# Patient Record
Sex: Female | Born: 1965
Health system: Southern US, Community
[De-identification: ages and names within clinical notes are randomized; demographics above are authoritative.]

## PROBLEM LIST (undated history)

## (undated) DIAGNOSIS — G43909 Migraine, unspecified, not intractable, without status migrainosus: Secondary | ICD-10-CM

## (undated) DIAGNOSIS — Z800 Family history of malignant neoplasm of digestive organs: Secondary | ICD-10-CM

## (undated) HISTORY — DX: Migraine, unspecified, not intractable, without status migrainosus: G43.909

## (undated) HISTORY — PX: VAGINAL HYSTERECTOMY: SUR661

## (undated) HISTORY — PX: TONSILLECTOMY: SUR1361

## (undated) HISTORY — PX: WISDOM TOOTH EXTRACTION: SHX21

## (undated) HISTORY — DX: Family history of malignant neoplasm of digestive organs: Z80.0

## (undated) HISTORY — PX: BREAST CYST ASPIRATION: SHX578

---

## 2005-02-09 ENCOUNTER — Ambulatory Visit: Payer: Self-pay

## 2006-02-19 ENCOUNTER — Ambulatory Visit: Payer: Self-pay

## 2007-03-04 ENCOUNTER — Ambulatory Visit: Payer: Self-pay

## 2007-03-17 ENCOUNTER — Ambulatory Visit: Payer: Self-pay

## 2008-04-13 ENCOUNTER — Ambulatory Visit: Payer: Self-pay

## 2009-04-19 ENCOUNTER — Ambulatory Visit: Payer: Self-pay

## 2010-04-20 ENCOUNTER — Ambulatory Visit: Payer: Self-pay

## 2011-05-21 ENCOUNTER — Ambulatory Visit: Payer: Self-pay

## 2016-04-24 ENCOUNTER — Other Ambulatory Visit: Payer: Self-pay | Admitting: Obstetrics & Gynecology

## 2016-04-24 DIAGNOSIS — Z1231 Encounter for screening mammogram for malignant neoplasm of breast: Secondary | ICD-10-CM

## 2016-05-28 ENCOUNTER — Encounter (HOSPITAL_COMMUNITY): Payer: Self-pay

## 2016-05-28 ENCOUNTER — Ambulatory Visit
Admission: RE | Admit: 2016-05-28 | Discharge: 2016-05-28 | Disposition: A | Discharge status: Home / Self Care | Payer: BLUE CROSS/BLUE SHIELD | Source: Ambulatory Visit | Attending: Obstetrics & Gynecology | Admitting: Obstetrics & Gynecology

## 2016-05-28 DIAGNOSIS — Z1231 Encounter for screening mammogram for malignant neoplasm of breast: Secondary | ICD-10-CM | POA: Diagnosis not present

## 2016-05-31 ENCOUNTER — Inpatient Hospital Stay
Admission: RE | Admit: 2016-05-31 | Discharge: 2016-05-31 | Disposition: A | Discharge status: Home / Self Care | Payer: Self-pay | Source: Ambulatory Visit | Attending: *Deleted | Admitting: *Deleted

## 2016-05-31 ENCOUNTER — Other Ambulatory Visit: Payer: Self-pay | Admitting: *Deleted

## 2016-05-31 DIAGNOSIS — Z9289 Personal history of other medical treatment: Secondary | ICD-10-CM

## 2016-06-27 DIAGNOSIS — D485 Neoplasm of uncertain behavior of skin: Secondary | ICD-10-CM | POA: Diagnosis not present

## 2016-06-27 DIAGNOSIS — Z1283 Encounter for screening for malignant neoplasm of skin: Secondary | ICD-10-CM | POA: Diagnosis not present

## 2016-06-27 DIAGNOSIS — Z85828 Personal history of other malignant neoplasm of skin: Secondary | ICD-10-CM | POA: Diagnosis not present

## 2016-06-27 DIAGNOSIS — L821 Other seborrheic keratosis: Secondary | ICD-10-CM | POA: Diagnosis not present

## 2016-12-24 DIAGNOSIS — R1310 Dysphagia, unspecified: Secondary | ICD-10-CM | POA: Diagnosis not present

## 2017-01-17 DIAGNOSIS — Z23 Encounter for immunization: Secondary | ICD-10-CM | POA: Diagnosis not present

## 2017-02-11 DIAGNOSIS — K219 Gastro-esophageal reflux disease without esophagitis: Secondary | ICD-10-CM | POA: Diagnosis not present

## 2017-03-14 DIAGNOSIS — E538 Deficiency of other specified B group vitamins: Secondary | ICD-10-CM | POA: Diagnosis not present

## 2017-03-21 DIAGNOSIS — E538 Deficiency of other specified B group vitamins: Secondary | ICD-10-CM | POA: Diagnosis not present

## 2017-03-21 DIAGNOSIS — Z0000 Encounter for general adult medical examination without abnormal findings: Secondary | ICD-10-CM | POA: Diagnosis not present

## 2017-04-22 ENCOUNTER — Other Ambulatory Visit: Payer: Self-pay | Admitting: Internal Medicine

## 2017-04-22 DIAGNOSIS — Z1231 Encounter for screening mammogram for malignant neoplasm of breast: Secondary | ICD-10-CM

## 2017-05-13 DIAGNOSIS — J040 Acute laryngitis: Secondary | ICD-10-CM | POA: Diagnosis not present

## 2017-05-29 ENCOUNTER — Ambulatory Visit
Admission: RE | Admit: 2017-05-29 | Discharge: 2017-05-29 | Disposition: A | Discharge status: Home / Self Care | Payer: BLUE CROSS/BLUE SHIELD | Source: Ambulatory Visit | Attending: Internal Medicine | Admitting: Internal Medicine

## 2017-05-29 DIAGNOSIS — Z1231 Encounter for screening mammogram for malignant neoplasm of breast: Secondary | ICD-10-CM | POA: Diagnosis not present

## 2017-07-15 DIAGNOSIS — L821 Other seborrheic keratosis: Secondary | ICD-10-CM | POA: Diagnosis not present

## 2017-07-15 DIAGNOSIS — Z1283 Encounter for screening for malignant neoplasm of skin: Secondary | ICD-10-CM | POA: Diagnosis not present

## 2017-07-15 DIAGNOSIS — Z85828 Personal history of other malignant neoplasm of skin: Secondary | ICD-10-CM | POA: Diagnosis not present

## 2017-07-15 DIAGNOSIS — D485 Neoplasm of uncertain behavior of skin: Secondary | ICD-10-CM | POA: Diagnosis not present

## 2017-09-16 ENCOUNTER — Ambulatory Visit: Payer: Self-pay | Admitting: Obstetrics and Gynecology

## 2017-09-16 ENCOUNTER — Ambulatory Visit (INDEPENDENT_AMBULATORY_CARE_PROVIDER_SITE_OTHER): Payer: BLUE CROSS/BLUE SHIELD | Admitting: Obstetrics and Gynecology

## 2017-09-16 ENCOUNTER — Encounter: Payer: Self-pay | Admitting: Obstetrics and Gynecology

## 2017-09-16 VITALS — BP 120/80 | Ht 68.0 in | Wt 175.0 lb

## 2017-09-16 DIAGNOSIS — N764 Abscess of vulva: Secondary | ICD-10-CM

## 2017-09-16 MED ORDER — SULFAMETHOXAZOLE-TRIMETHOPRIM 800-160 MG PO TABS: 1.0000 | ORAL_TABLET | Freq: Two times a day (BID) | ORAL | 0 refills | Status: DC

## 2017-09-16 NOTE — Progress Notes (Signed)
Obstetrics & Gynecology Office Visit   Chief Complaint:  Chief Complaint  Patient presents with  . vaginal bump    History of Present Illness: Patient is a 52 year old caucasian female presenting for evaluation of a tender vulvar/labial mass.  The area became quite bothersome to the patient last week and she had tried to obtain an appointment on Friday but was unable to be seen until today.  There has been no drainage from the area.  She has not noted any fevers, chills, or inciting trauma to the area. The patient denies similar lesion in the past.  Pain is worsened with ambulation, and sitting.  7/10 pain.  Review of Systems: 10 point review of systems negative unless otherwise noted in hpi  Past Medical History:  History reviewed. No pertinent past medical history.  Past Surgical History:  Past Surgical History:  Procedure Laterality Date  . BREAST CYST ASPIRATION     Pt does not remember    Gynecologic History: No LMP recorded. Patient has had a hysterectomy.  Obstetric History: G1P1001  Family History:  Family History  Problem Relation Age of Onset  . Breast cancer Neg Hx     Social History:  Social History   Socioeconomic History  . Marital status: Single    Spouse name: Not on file  . Number of children: Not on file  . Years of education: Not on file  . Highest education level: Not on file  Occupational History  . Not on file  Social Needs  . Financial resource strain: Not on file  . Food insecurity:    Worry: Not on file    Inability: Not on file  . Transportation needs:    Medical: Not on file    Non-medical: Not on file  Tobacco Use  . Smoking status: Never Smoker  . Smokeless tobacco: Never Used  Substance and Sexual Activity  . Alcohol use: Not Currently  . Drug use: Never  . Sexual activity: Not Currently  Lifestyle  . Physical activity:    Days per week: Not on file    Minutes per session: Not on file  . Stress: Not on file    Relationships  . Social connections:    Talks on phone: Not on file    Gets together: Not on file    Attends religious service: Not on file    Active member of club or organization: Not on file    Attends meetings of clubs or organizations: Not on file    Relationship status: Not on file  . Intimate partner violence:    Fear of current or ex partner: Not on file    Emotionally abused: Not on file    Physically abused: Not on file    Forced sexual activity: Not on file  Other Topics Concern  . Not on file  Social History Narrative  . Not on file    Allergies:  Allergies  Allergen Reactions  . Erythromycin Diarrhea  . Venlafaxine Other (See Comments)    Paranoia    Medications: Prior to Admission medications   Medication Sig Start Date End Date Taking? Authorizing Provider  ALPRAZolam (XANAX) 0.25 MG tablet TAKE 1 TABLET BY MOUTH TWICE A DAY 06/20/17  Yes [provider]  diclofenac (VOLTAREN) 75 MG EC tablet TAKE 1 TABLET TWICE DAILY WITH FOOD 03/10/17  Yes [provider]  DULoxetine (CYMBALTA) 30 MG capsule TAKE 1 CAPSULE (30 MG TOTAL) BY MOUTH ONCE DAILY.  11/23/16  Yes [provider]  pantoprazole (PROTONIX) 40 MG tablet Take by mouth. 02/11/17 02/11/18 Yes [provider]  SUMAtriptan (IMITREX) 50 MG tablet TAKE 1 TABLET BY MOUTH AS NEEDED FOR MIGRAINE FOR 1 DOSE-MAY TAKE 2ND DOSE AFTER 2 HOURS IF NEEDED 05/13/17  Yes [provider]  docusate sodium (COLACE) 100 MG capsule Take by mouth.    [provider]  Magnesium 250 MG TABS Take by mouth.    [provider]  Multiple Vitamin (MULTI-VITAMINS) TABS Take by mouth.    [provider]  sulfamethoxazole-trimethoprim (BACTRIM DS) 800-160 MG tablet Take 1 tablet by mouth 2 (two) times daily for 10 days. 09/16/17 09/26/17  Vena AustriaStaebler, Idara Woodside, MD    Physical Exam Vitals:  Vitals:   09/16/17 1031  BP: 120/80   No LMP recorded. Patient has had a  hysterectomy.  General: NAD HEENT: normocephalic, anicteric Pulmonary: No increased work of breathing Genitourinary:  External: The left labia contains a tender fluctuant area.  The area is relatively small approximately 2cm.  Needle aspiration of the site does reveal frank bloody purulent discharge.  Normal urethral meatus, normal Bartholin's and Skene's glands.    Rectal: deferred  Lymphatic: no evidence of inguinal lymphadenopathy Extremities: no edema, erythema, or tenderness Neurologic: Grossly intact Psychiatric: mood appropriate, affect full  Female chaperone present for pelvic  portions of the physical   VULVAR I&D NOTE The indications for vulvar I&D were reviewed.   Risks of the I&D including pain, bleeding, infection,  scarring and need for additional procedures  were discussed. The patient stated understanding and agreed to undergo procedure today. The patient's vulva was prepped with Betadine. 1% lidocaine was injected into in the skin overlying the abscess cavity. An 11 blade scalpel was used to incise the abscess cavity with thick purulent discharge noted.  A Q-tip was used to break up any loculations.  A wick of 1/2" iodinated packing strips was placed to facilitate drainage.  The patient is to call with heavy bleeding, fever greater than 100.4, foul smelling vaginal discharge or other concerns.   Assessment: 52 y.o. G1P1001 with left labial abscess  Plan: Problem List Items Addressed This Visit    None    Visit Diagnoses    Labial abscess    -  Primary   Relevant Orders   Wound culture     1) Left labial abscess - relatively small abscess cavity, wick of 1/2" iodinated packing strip placed.  The patient does not feel she will be able to proceed with packing changes unassisted.  She was given the option of home health vs in clinic packing changes and opts for in clinic.  I anticipate she will need packing changes for approximately 1 week given small size of the abscess  cavity. She may also apply warm compresses and sitz baths for further symptoms relief.  - rx bactrim DS po bid x 10 days - wound culture sent  2)Return in about 1 day (around 09/17/2017) for packing change.   Vena AustriaAndreas Nolan Lasser, MD, Merlinda FrederickFACOG [facility], [facility]

## 2017-09-17 ENCOUNTER — Encounter: Payer: Self-pay | Admitting: Obstetrics & Gynecology

## 2017-09-17 ENCOUNTER — Ambulatory Visit: Payer: BLUE CROSS/BLUE SHIELD | Admitting: Obstetrics & Gynecology

## 2017-09-17 ENCOUNTER — Ambulatory Visit (INDEPENDENT_AMBULATORY_CARE_PROVIDER_SITE_OTHER): Payer: BLUE CROSS/BLUE SHIELD | Admitting: Obstetrics & Gynecology

## 2017-09-17 VITALS — BP 120/80 | Ht 68.0 in | Wt 176.0 lb

## 2017-09-17 DIAGNOSIS — N764 Abscess of vulva: Secondary | ICD-10-CM | POA: Insufficient documentation

## 2017-09-17 MED ORDER — SULFAMETHOXAZOLE-TRIMETHOPRIM 800-160 MG PO TABS: 1.0000 | ORAL_TABLET | Freq: Two times a day (BID) | ORAL | 0 refills | Status: AC

## 2017-09-17 NOTE — Progress Notes (Signed)
  HPI:      [name] is a 52 y.o. G1P1001 who presents today for her vulvar abscess follow up and examination.   Pt reports tolerating the w packing after the procedure yesterday well with  no bleeding and no significant discharge.   Packing fell out this am.  No fever.  Taking ABX.  PMHx: She  has no past medical history on file. Also,  has a past surgical history that includes Breast cyst aspiration., family history is not on file.,  reports that she has never smoked. She has never used smokeless tobacco. She reports that she drank alcohol. She reports that she does not use drugs.  She has a current medication list which includes the following prescription(s): alprazolam, diclofenac, docusate sodium, duloxetine, magnesium, multi-vitamins, pantoprazole, sulfamethoxazole-trimethoprim, and sumatriptan. Also, is allergic to erythromycin and venlafaxine.  Review of Systems  All other systems reviewed and are negative.  Objective: BP 120/80   Ht 5\' 8"  (1.727 m)   Wt 176 lb (79.8 kg)   BMI 26.76 kg/m  Physical Exam  Constitutional: She is oriented to person, place, and time. She appears well-developed and well-nourished. No distress.  Genitourinary: Vagina normal and uterus normal. Pelvic exam was performed with patient supine. There is no rash, tenderness or lesion on the right labia.  There is tenderness on the left labia. There is no rash or lesion on the left labia.    No erythema or bleeding in the vagina. Right adnexum does not display mass and does not display tenderness. Left adnexum does not display mass and does not display tenderness. Cervix does not exhibit motion tenderness, discharge, polyp or nabothian cyst.   Uterus is mobile and midaxial. Uterus is not enlarged or exhibiting a mass.  Genitourinary Comments: Left incision from I&D healing well w no erythema to skin and scant drainage expressed Small opening not amenable to re-packing  HENT:  Head: Normocephalic and  atraumatic.  Nose: Nose normal.  Mouth/Throat: Oropharynx is clear and moist.  Abdominal: Soft. She exhibits no distension. There is no tenderness.  Musculoskeletal: Normal range of motion.  Neurological: She is alert and oriented to person, place, and time. No cranial nerve deficit.  Skin: Skin is warm and dry.  Psychiatric: She has a normal mood and affect.    A/P:  Labial Abscess Cont ABX Sitz baths Heat Recheck tomorrow Consider repacking w new I&D if reforms  A total of 15 minutes were spent face-to-face with the patient during this encounter and over half of that time dealt with counseling and coordination of care.  Annamarie MajorPaul Zahirah Cheslock, MD, Merlinda FrederickFACOG [facility], [facility] 09/17/2017  5:05 PM

## 2017-09-18 ENCOUNTER — Encounter: Payer: Self-pay | Admitting: Obstetrics & Gynecology

## 2017-09-18 ENCOUNTER — Ambulatory Visit (INDEPENDENT_AMBULATORY_CARE_PROVIDER_SITE_OTHER): Payer: BLUE CROSS/BLUE SHIELD | Admitting: Obstetrics & Gynecology

## 2017-09-18 VITALS — BP 120/70 | Ht 68.0 in | Wt 176.0 lb

## 2017-09-18 DIAGNOSIS — Z1272 Encounter for screening for malignant neoplasm of vagina: Secondary | ICD-10-CM

## 2017-09-18 DIAGNOSIS — Z1211 Encounter for screening for malignant neoplasm of colon: Secondary | ICD-10-CM

## 2017-09-18 DIAGNOSIS — N764 Abscess of vulva: Secondary | ICD-10-CM

## 2017-09-18 LAB — WOUND CULTURE

## 2017-09-18 NOTE — Progress Notes (Signed)
HPI:      [name] is a 52 y.o. G1P1001 who presents today for her vulvar abscess follow up and examination.   Pt report improved pain and no discharge. No fever.  Taking ABX.  No packing as opening and shrunk in size as of yesterdays exam.  PMHx: She  has no past medical history on file. Also,  has a past surgical history that includes Breast cyst aspiration., family history is not on file.,  reports that she has never smoked. She has never used smokeless tobacco. She reports that she drank alcohol. She reports that she does not use drugs.  She has a current medication list which includes the following prescription(s): alprazolam, diclofenac, docusate sodium, duloxetine, magnesium, multi-vitamins, pantoprazole, sulfamethoxazole-trimethoprim, and sumatriptan. Also, is allergic to erythromycin and venlafaxine.  Review of Systems  All other systems reviewed and are negative.  Objective: BP 120/70   Ht 5\' 8"  (1.727 m)   Wt 176 lb (79.8 kg)   BMI 26.76 kg/m  Physical Exam  Constitutional: She is oriented to person, place, and time. She appears well-developed and well-nourished. No distress.  Genitourinary: Vagina normal and uterus normal. Pelvic exam was performed with patient supine. There is no rash, tenderness or lesion on the right labia.  There is min tenderness on the left labia. There is no rash or lesion on the left labia.    No erythema or bleeding in the vagina. Right adnexum does not display mass and does not display tenderness. Left adnexum does not display mass and does not display tenderness. Cervix does not exhibit motion tenderness, discharge, polyp or nabothian cyst.   Uterus is mobile and midaxial. Uterus is not enlarged or exhibiting a mass.  Genitourinary Comments: Left incision from I&D healing well w no erythema to skin and no drainage expressed Smaller opening not amenable to re-packing  HENT:  Head: Normocephalic and atraumatic.  Nose: Nose normal.    Mouth/Throat: Oropharynx is clear and moist.  Abdominal: Soft. She exhibits no distension. There is no tenderness.  Musculoskeletal: Normal range of motion.  Neurological: She is alert and oriented to person, place, and time. No cranial nerve deficit.  Skin: Skin is warm and dry.  Psychiatric: She has a normal mood and affect.   A/P:  Labial Abscess- resolving Monitor for relapse Cont ABX Sitz baths Heat PAP today while under exam Plan f/u one week A total of 15 minutes were spent face-to-face with the patient during this encounter and over half of that time dealt with counseling and coordination of care.  Annamarie MajorPaul Zariyah Stephens, MD, Merlinda FrederickFACOG [facility], [facility] 09/18/2017  4:48 PM

## 2017-09-19 ENCOUNTER — Ambulatory Visit: Payer: BLUE CROSS/BLUE SHIELD | Admitting: Obstetrics and Gynecology

## 2017-09-24 ENCOUNTER — Encounter: Payer: Self-pay | Admitting: Obstetrics & Gynecology

## 2017-09-24 LAB — PAP IG (IMAGE GUIDED): PAP SMEAR COMMENT: 0

## 2017-09-26 ENCOUNTER — Encounter: Payer: Self-pay | Admitting: Obstetrics & Gynecology

## 2017-09-26 ENCOUNTER — Ambulatory Visit (INDEPENDENT_AMBULATORY_CARE_PROVIDER_SITE_OTHER): Payer: BLUE CROSS/BLUE SHIELD | Admitting: Obstetrics & Gynecology

## 2017-09-26 VITALS — BP 100/70 | Ht 68.0 in | Wt 173.0 lb

## 2017-09-26 DIAGNOSIS — Z0000 Encounter for general adult medical examination without abnormal findings: Secondary | ICD-10-CM | POA: Diagnosis not present

## 2017-09-26 DIAGNOSIS — Z01419 Encounter for gynecological examination (general) (routine) without abnormal findings: Secondary | ICD-10-CM | POA: Diagnosis not present

## 2017-09-26 NOTE — Progress Notes (Signed)
HPI:      Brittany Ewing is a 52 y.o. G1P1001 who LMP was in the past as has had hysterectomy she presents today for her annual examination.  The patient has no complaints today.  IMPROVED FROM VULVAR PROCEDURE. The patient is sexually active. Herlast pap: was normal and last mammogram: approximate date 05/2017 and was normal.  The patient does perform self breast exams.  There is notable family history of breast or ovarian cancer in her family. The patient is not taking hormone replacement therapy. Patient denies post-menopausal vaginal bleeding.   The patient has regular exercise: yes. The patient denies current symptoms of depression.  Concerned over weight gain, about 10 lbs over last year.  No significant menopausal sx's.  GYN Hx: Last Colonoscopy:2 years ago. Normal.  Last DEXA: never ago.    PMHx: Past Medical History:  Diagnosis Date  . Migraine    Past Surgical History:  Procedure Laterality Date  . BREAST CYST ASPIRATION     Pt does not remember  . TONSILLECTOMY    . VAGINAL HYSTERECTOMY    . WISDOM TOOTH EXTRACTION     Family History  Problem Relation Age of Onset  . Heart disease Maternal Grandmother   . Heart disease Maternal Grandfather   . Heart disease Paternal Grandmother   . Heart disease Paternal Grandfather   . Breast cancer Neg Hx    Social History   Tobacco Use  . Smoking status: Never Smoker  . Smokeless tobacco: Never Used  Substance Use Topics  . Alcohol use: Not Currently  . Drug use: Never    Current Outpatient Medications:  .  ALPRAZolam (XANAX) 0.25 MG tablet, TAKE 1 TABLET BY MOUTH TWICE A DAY, Disp: , Rfl:  .  diclofenac (VOLTAREN) 75 MG EC tablet, TAKE 1 TABLET TWICE DAILY WITH FOOD, Disp: , Rfl:  .  docusate sodium (COLACE) 100 MG capsule, Take by mouth., Disp: , Rfl:  .  DULoxetine (CYMBALTA) 30 MG capsule, TAKE 1 CAPSULE (30 MG TOTAL) BY MOUTH ONCE DAILY., Disp: , Rfl:  .  Magnesium 250 MG TABS, Take by mouth., Disp: , Rfl:  .   Multiple Vitamin (MULTI-VITAMINS) TABS, Take by mouth., Disp: , Rfl:  .  pantoprazole (PROTONIX) 40 MG tablet, Take by mouth., Disp: , Rfl:  .  sulfamethoxazole-trimethoprim (BACTRIM DS) 800-160 MG tablet, Take 1 tablet by mouth 2 (two) times daily for 10 days., Disp: 20 tablet, Rfl: 0 .  SUMAtriptan (IMITREX) 50 MG tablet, TAKE 1 TABLET BY MOUTH AS NEEDED FOR MIGRAINE FOR 1 DOSE-MAY TAKE 2ND DOSE AFTER 2 HOURS IF NEEDED, Disp: , Rfl:  Allergies: Erythromycin and Venlafaxine  Review of Systems  Constitutional: Negative for chills, fever and malaise/fatigue.  HENT: Negative for congestion, sinus pain and sore throat.   Eyes: Negative for blurred vision and pain.  Respiratory: Negative for cough and wheezing.   Cardiovascular: Negative for chest pain and leg swelling.  Gastrointestinal: Negative for abdominal pain, constipation, diarrhea, heartburn, nausea and vomiting.  Genitourinary: Negative for dysuria, frequency, hematuria and urgency.  Musculoskeletal: Negative for back pain, joint pain, myalgias and neck pain.  Skin: Negative for itching and rash.  Neurological: Negative for dizziness, tremors and weakness.  Endo/Heme/Allergies: Does not bruise/bleed easily.  Psychiatric/Behavioral: Negative for depression. The patient is not nervous/anxious and does not have insomnia.     Objective: BP 100/70   Ht 5' 8"  (1.727 m)   Wt 173 lb (78.5 kg)   BMI 26.30 kg/m  Filed Weights   09/26/17 0802  Weight: 173 lb (78.5 kg)   Body mass index is 26.3 kg/m. Physical Exam  Constitutional: She is oriented to person, place, and time. She appears well-developed and well-nourished. No distress.  Genitourinary: Rectum normal and vagina normal. Pelvic exam was performed with patient supine. There is no rash or lesion on the right labia. There is no rash or lesion on the left labia. Vagina exhibits no lesion. No bleeding in the vagina. Right adnexum does not display mass and does not display  tenderness. Left adnexum does not display mass and does not display tenderness.  Genitourinary Comments: Absent Uterus Absent cervix Vaginal cuff well healed External labia healed without lesion  HENT:  Head: Normocephalic and atraumatic. Head is without laceration.  Right Ear: Hearing normal.  Left Ear: Hearing normal.  Nose: No epistaxis.  No foreign bodies.  Mouth/Throat: Uvula is midline, oropharynx is clear and moist and mucous membranes are normal.  Eyes: Pupils are equal, round, and reactive to light.  Neck: Normal range of motion. Neck supple. No thyromegaly present.  Cardiovascular: Normal rate and regular rhythm. Exam reveals no gallop and no friction rub.  No murmur heard. Pulmonary/Chest: Effort normal and breath sounds normal. No respiratory distress. She has no wheezes. Right breast exhibits no mass, no skin change and no tenderness. Left breast exhibits no mass, no skin change and no tenderness.  Abdominal: Soft. Bowel sounds are normal. She exhibits no distension. There is no tenderness. There is no rebound.  Musculoskeletal: Normal range of motion.  Neurological: She is alert and oriented to person, place, and time. No cranial nerve deficit.  Skin: Skin is warm and dry.  Psychiatric: She has a normal mood and affect. Judgment normal.  Vitals reviewed.   Assessment: Annual Exam 1. Annual physical exam     Plan:            1.  Cervical Screening-  Pap smear schedule reviewed with patient  2. Breast screening- Exam annually and mammogram scheduled  3. Colonoscopy every 10 years, Hemoccult testing after age 86  4. Labs managed by PCP  5. Counseling for hormonal therapy: none  6.  She presents with a significant personal and/or family history of breast cancer multiple Great Aunts (maternal side). Details of which can be found in her medical/family history. She does not have a previously identified BRCA and Lynch syndrome mutation in her family. Due to her personal  and/or family history of cancer she is a candidate for the [facility] test(s).    Risk for cancer, genetic susceptibility discussed.  Patient has declined gene testing.  Discussed BRCA as well as Lynch syndrome and other cancer risk assessments available based on her family history and personal history. Pros and cons of testing discussed.      F/U  Return in about 1 year (around 09/27/2018) for Annual.  Barnett Applebaum, MD, [facility], [facility] 09/26/2017  8:23 AM

## 2018-01-23 DIAGNOSIS — Z23 Encounter for immunization: Secondary | ICD-10-CM | POA: Diagnosis not present

## 2018-03-17 DIAGNOSIS — Z0000 Encounter for general adult medical examination without abnormal findings: Secondary | ICD-10-CM | POA: Diagnosis not present

## 2018-03-17 DIAGNOSIS — Z1389 Encounter for screening for other disorder: Secondary | ICD-10-CM | POA: Diagnosis not present

## 2018-03-17 DIAGNOSIS — E538 Deficiency of other specified B group vitamins: Secondary | ICD-10-CM | POA: Diagnosis not present

## 2018-03-24 DIAGNOSIS — Z0000 Encounter for general adult medical examination without abnormal findings: Secondary | ICD-10-CM | POA: Diagnosis not present

## 2018-04-04 DIAGNOSIS — H8110 Benign paroxysmal vertigo, unspecified ear: Secondary | ICD-10-CM | POA: Diagnosis not present

## 2018-04-21 ENCOUNTER — Other Ambulatory Visit: Payer: Self-pay | Admitting: Internal Medicine

## 2018-04-21 DIAGNOSIS — Z1231 Encounter for screening mammogram for malignant neoplasm of breast: Secondary | ICD-10-CM

## 2018-06-02 ENCOUNTER — Ambulatory Visit
Admission: RE | Admit: 2018-06-02 | Discharge: 2018-06-02 | Disposition: A | Discharge status: Home / Self Care | Payer: BLUE CROSS/BLUE SHIELD | Source: Ambulatory Visit | Attending: Internal Medicine | Admitting: Internal Medicine

## 2018-06-02 DIAGNOSIS — Z1231 Encounter for screening mammogram for malignant neoplasm of breast: Secondary | ICD-10-CM | POA: Insufficient documentation

## 2018-09-24 DIAGNOSIS — D485 Neoplasm of uncertain behavior of skin: Secondary | ICD-10-CM | POA: Diagnosis not present

## 2018-09-24 DIAGNOSIS — L820 Inflamed seborrheic keratosis: Secondary | ICD-10-CM | POA: Diagnosis not present

## 2018-09-24 DIAGNOSIS — L578 Other skin changes due to chronic exposure to nonionizing radiation: Secondary | ICD-10-CM | POA: Diagnosis not present

## 2018-09-24 DIAGNOSIS — Z1283 Encounter for screening for malignant neoplasm of skin: Secondary | ICD-10-CM | POA: Diagnosis not present

## 2018-09-24 DIAGNOSIS — Z85828 Personal history of other malignant neoplasm of skin: Secondary | ICD-10-CM | POA: Diagnosis not present

## 2018-10-16 ENCOUNTER — Ambulatory Visit: Payer: BLUE CROSS/BLUE SHIELD | Admitting: Obstetrics & Gynecology

## 2018-11-17 ENCOUNTER — Encounter: Payer: Self-pay | Admitting: Obstetrics & Gynecology

## 2018-11-17 ENCOUNTER — Ambulatory Visit (INDEPENDENT_AMBULATORY_CARE_PROVIDER_SITE_OTHER): Payer: BC Managed Care – PPO | Admitting: Obstetrics & Gynecology

## 2018-11-17 ENCOUNTER — Other Ambulatory Visit: Payer: Self-pay

## 2018-11-17 VITALS — BP 120/80 | Ht 68.0 in | Wt 168.0 lb

## 2018-11-17 DIAGNOSIS — Z1211 Encounter for screening for malignant neoplasm of colon: Secondary | ICD-10-CM

## 2018-11-17 DIAGNOSIS — Z1239 Encounter for other screening for malignant neoplasm of breast: Secondary | ICD-10-CM

## 2018-11-17 DIAGNOSIS — Z01419 Encounter for gynecological examination (general) (routine) without abnormal findings: Secondary | ICD-10-CM

## 2018-11-17 NOTE — Progress Notes (Signed)
HPI:      Brittany Ewing is a 53 y.o. G1P1001 who LMP was in the past, she presents today for her annual examination.  The patient has no complaints today. The patient is sexually active. Herlast pap: approximate date 2019 and was normal and last mammogram: approximate date 05/2018 and was normal.  The patient does perform self breast exams.  There is no notable family history of breast or ovarian cancer in her family. The patient is not taking hormone replacement therapy. Patient denies post-menopausal vaginal bleeding.   The patient has regular exercise: yes. The patient denies current symptoms of depression.    GYN Hx: Last Colonoscopy:3 years ago. Normal.  Last DEXA: never ago.    PMHx: Past Medical History:  Diagnosis Date  . Migraine    Past Surgical History:  Procedure Laterality Date  . BREAST CYST ASPIRATION     Pt does not remember  . TONSILLECTOMY    . VAGINAL HYSTERECTOMY    . WISDOM TOOTH EXTRACTION     Family History  Problem Relation Age of Onset  . Heart disease Maternal Grandmother   . Heart disease Maternal Grandfather   . Heart disease Paternal Grandmother   . Heart disease Paternal Grandfather   . Breast cancer Neg Hx    Social History   Tobacco Use  . Smoking status: Never Smoker  . Smokeless tobacco: Never Used  Substance Use Topics  . Alcohol use: Not Currently  . Drug use: Never    Current Outpatient Medications:  .  ALPRAZolam (XANAX) 0.25 MG tablet, TAKE 1 TABLET BY MOUTH TWICE A DAY, Disp: , Rfl:  .  diclofenac (VOLTAREN) 75 MG EC tablet, TAKE 1 TABLET TWICE DAILY WITH FOOD, Disp: , Rfl:  .  docusate sodium (COLACE) 100 MG capsule, Take by mouth., Disp: , Rfl:  .  DULoxetine (CYMBALTA) 30 MG capsule, TAKE 1 CAPSULE (30 MG TOTAL) BY MOUTH ONCE DAILY., Disp: , Rfl:  .  Magnesium 250 MG TABS, Take by mouth., Disp: , Rfl:  .  Multiple Vitamin (MULTI-VITAMINS) TABS, Take by mouth., Disp: , Rfl:  .  SUMAtriptan (IMITREX) 50 MG tablet, TAKE 1  TABLET BY MOUTH AS NEEDED FOR MIGRAINE FOR 1 DOSE-MAY TAKE 2ND DOSE AFTER 2 HOURS IF NEEDED, Disp: , Rfl:  .  pantoprazole (PROTONIX) 40 MG tablet, Take by mouth., Disp: , Rfl:  Allergies: Erythromycin and Venlafaxine  Review of Systems  Constitutional: Negative for chills, fever and malaise/fatigue.  HENT: Negative for congestion, sinus pain and sore throat.   Eyes: Negative for blurred vision and pain.  Respiratory: Negative for cough and wheezing.   Cardiovascular: Negative for chest pain and leg swelling.  Gastrointestinal: Negative for abdominal pain, constipation, diarrhea, heartburn, nausea and vomiting.  Genitourinary: Negative for dysuria, frequency, hematuria and urgency.  Musculoskeletal: Negative for back pain, joint pain, myalgias and neck pain.  Skin: Negative for itching and rash.  Neurological: Negative for dizziness, tremors and weakness.  Endo/Heme/Allergies: Does not bruise/bleed easily.  Psychiatric/Behavioral: Negative for depression. The patient is not nervous/anxious and does not have insomnia.     Objective: BP 120/80   Ht 5\' 8"  (1.727 m)   Wt 168 lb (76.2 kg)   BMI 25.54 kg/m   Filed Weights   11/17/18 1019  Weight: 168 lb (76.2 kg)   Body mass index is 25.54 kg/m. Physical Exam Constitutional:      General: She is not in acute distress.    Appearance: She is  well-developed.  Genitourinary:     Pelvic exam was performed with patient supine.     Vagina and rectum normal.     No lesions in the vagina.     No vaginal bleeding.     No right or left adnexal mass present.     Right adnexa not tender.     Left adnexa not tender.     Genitourinary Comments: Absent Uterus Absent cervix Vaginal cuff well healed  HENT:     Head: Normocephalic and atraumatic. No laceration.     Right Ear: Hearing normal.     Left Ear: Hearing normal.     Mouth/Throat:     Pharynx: Uvula midline.  Eyes:     Pupils: Pupils are equal, round, and reactive to light.  Neck:      Musculoskeletal: Normal range of motion and neck supple.     Thyroid: No thyromegaly.  Cardiovascular:     Rate and Rhythm: Normal rate and regular rhythm.     Heart sounds: No murmur. No friction rub. No gallop.   Pulmonary:     Effort: Pulmonary effort is normal. No respiratory distress.     Breath sounds: Normal breath sounds. No wheezing.  Chest:     Breasts:        Right: No mass, skin change or tenderness.        Left: No mass, skin change or tenderness.  Abdominal:     General: Bowel sounds are normal. There is no distension.     Palpations: Abdomen is soft.     Tenderness: There is no abdominal tenderness. There is no rebound.  Musculoskeletal: Normal range of motion.  Neurological:     Mental Status: She is alert and oriented to person, place, and time.     Cranial Nerves: No cranial nerve deficit.  Skin:    General: Skin is warm and dry.  Psychiatric:        Judgment: Judgment normal.  Vitals signs reviewed.     Assessment: Annual Exam 1. Women's annual routine gynecological examination   2. Screen for colon cancer   3. Screening for breast cancer     Plan:            1.  Cervical Screening-  Pap smear schedule reviewed with patient  2. Breast screening- Exam annually and mammogram scheduled  3. Colonoscopy every 10 years, Hemoccult testing after age 55  4. Labs managed by PCP  5. Counseling for hormonal therapy: none              6. FRAX - FRAX score for assessing the 10 year probability for fracture calculated and discussed today.  Based on age and score today, DEXA is not currently scheduled.   7. Vulva, no relapse or concerns    F/U  Return in about 1 year (around 11/17/2019) for Annual.  Brittany MajorPaul Rafaelita Foister, MD, Merlinda FrederickFACOG [facility], [facility] 11/17/2018  10:47 AM

## 2018-11-17 NOTE — Patient Instructions (Signed)
PAP every 5 years Mammogram every year, due in Feb    Call [phone] to schedule at [facility] Colonoscopy every 5 years Labs yearly (with PCP)

## 2018-12-24 ENCOUNTER — Encounter: Payer: Self-pay | Admitting: Obstetrics and Gynecology

## 2019-03-24 DIAGNOSIS — Z20828 Contact with and (suspected) exposure to other viral communicable diseases: Secondary | ICD-10-CM | POA: Diagnosis not present

## 2019-04-06 DIAGNOSIS — Z20828 Contact with and (suspected) exposure to other viral communicable diseases: Secondary | ICD-10-CM | POA: Diagnosis not present

## 2019-06-10 ENCOUNTER — Ambulatory Visit
Admission: RE | Admit: 2019-06-10 | Discharge: 2019-06-10 | Disposition: A | Discharge status: Home / Self Care | Payer: BC Managed Care – PPO | Source: Ambulatory Visit | Attending: Obstetrics & Gynecology | Admitting: Obstetrics & Gynecology

## 2019-06-10 DIAGNOSIS — Z1231 Encounter for screening mammogram for malignant neoplasm of breast: Secondary | ICD-10-CM | POA: Insufficient documentation

## 2019-06-10 DIAGNOSIS — Z1239 Encounter for other screening for malignant neoplasm of breast: Secondary | ICD-10-CM

## 2019-06-11 ENCOUNTER — Encounter: Payer: Self-pay | Admitting: Obstetrics & Gynecology

## 2019-11-23 ENCOUNTER — Ambulatory Visit: Payer: BC Managed Care – PPO | Admitting: Obstetrics & Gynecology

## 2019-12-24 ENCOUNTER — Ambulatory Visit (INDEPENDENT_AMBULATORY_CARE_PROVIDER_SITE_OTHER): Payer: BC Managed Care – PPO | Admitting: Obstetrics & Gynecology

## 2019-12-24 ENCOUNTER — Encounter: Payer: Self-pay | Admitting: Obstetrics & Gynecology

## 2019-12-24 ENCOUNTER — Other Ambulatory Visit: Payer: Self-pay

## 2019-12-24 VITALS — BP 100/60 | Ht 68.0 in | Wt 175.0 lb

## 2019-12-24 DIAGNOSIS — Z23 Encounter for immunization: Secondary | ICD-10-CM | POA: Diagnosis not present

## 2019-12-24 DIAGNOSIS — Z1211 Encounter for screening for malignant neoplasm of colon: Secondary | ICD-10-CM | POA: Diagnosis not present

## 2019-12-24 DIAGNOSIS — Z1231 Encounter for screening mammogram for malignant neoplasm of breast: Secondary | ICD-10-CM | POA: Diagnosis not present

## 2019-12-24 DIAGNOSIS — Z01419 Encounter for gynecological examination (general) (routine) without abnormal findings: Secondary | ICD-10-CM

## 2019-12-24 NOTE — Patient Instructions (Signed)
Mammogram every year, due in Feb    Call [phone] to schedule at [facility] Colonoscopy every 10 years Labs yearly (with PCP)  Thank you for choosing [facility]. As part of our ongoing efforts to improve patient experience, we would appreciate your feedback. Please fill out the short survey that you will receive by mail or MyChart. Your opinion is important to [place]! - Dr. Tiburcio Pea

## 2019-12-24 NOTE — Progress Notes (Signed)
HPI:      [name] is a 54 y.o. G1P1001 who LMP was in the past w prior hysterectomy, she presents today for her annual examination.  The patient has no complaints today. The patient is sexually active. Herlast pap: approximate date 2019 and was normal and last mammogram: approximate date 2021\ and was normal.  The patient does perform self breast exams.  There is no notable family history of breast or ovarian cancer in her family. The patient is not taking hormone replacement therapy. Patient denies post-menopausal vaginal bleeding.   The patient has regular exercise: yes. The patient denies current symptoms of depression.    GYN Hx: Last Colonoscopy:4 years ago. Normal.  Last DEXA: never ago.    PMHx: Past Medical History:  Diagnosis Date  . Family history of pancreatic cancer    9/20 genetic testing letter sent  . Migraine    Past Surgical History:  Procedure Laterality Date  . BREAST CYST ASPIRATION     Pt does not remember  . TONSILLECTOMY    . VAGINAL HYSTERECTOMY    . WISDOM TOOTH EXTRACTION     Family History  Problem Relation Age of Onset  . Heart disease Maternal Grandmother   . Heart disease Maternal Grandfather   . Heart disease Paternal Grandmother   . Heart disease Paternal Grandfather   . Pancreatic cancer Maternal Aunt 40  . Colon cancer Maternal Aunt 40  . Breast cancer Neg Hx    Social History   Tobacco Use  . Smoking status: Never Smoker  . Smokeless tobacco: Never Used  Vaping Use  . Vaping Use: Never used  Substance Use Topics  . Alcohol use: Not Currently  . Drug use: Never    Current Outpatient Medications:  .  ALPRAZolam (XANAX) 0.25 MG tablet, TAKE 1 TABLET BY MOUTH TWICE A DAY, Disp: , Rfl:  .  diclofenac (VOLTAREN) 75 MG EC tablet, TAKE 1 TABLET TWICE DAILY WITH FOOD, Disp: , Rfl:  .  docusate sodium (COLACE) 100 MG capsule, Take by mouth., Disp: , Rfl:  .  DULoxetine (CYMBALTA) 30 MG capsule, TAKE 1 CAPSULE (30 MG TOTAL) BY  MOUTH ONCE DAILY., Disp: , Rfl:  .  Magnesium 250 MG TABS, Take by mouth., Disp: , Rfl:  .  Multiple Vitamin (MULTI-VITAMINS) TABS, Take by mouth., Disp: , Rfl:  .  SUMAtriptan (IMITREX) 50 MG tablet, TAKE 1 TABLET BY MOUTH AS NEEDED FOR MIGRAINE FOR 1 DOSE-MAY TAKE 2ND DOSE AFTER 2 HOURS IF NEEDED, Disp: , Rfl:  .  pantoprazole (PROTONIX) 40 MG tablet, Take by mouth., Disp: , Rfl:  Allergies: Erythromycin and Venlafaxine  Review of Systems  Constitutional: Negative for chills, fever and malaise/fatigue.  HENT: Negative for congestion, sinus pain and sore throat.   Eyes: Negative for blurred vision and pain.  Respiratory: Negative for cough and wheezing.   Cardiovascular: Negative for chest pain and leg swelling.  Gastrointestinal: Negative for abdominal pain, constipation, diarrhea, heartburn, nausea and vomiting.  Genitourinary: Negative for dysuria, frequency, hematuria and urgency.  Musculoskeletal: Negative for back pain, joint pain, myalgias and neck pain.  Skin: Negative for itching and rash.  Neurological: Negative for dizziness, tremors and weakness.  Endo/Heme/Allergies: Does not bruise/bleed easily.  Psychiatric/Behavioral: Negative for depression. The patient is not nervous/anxious and does not have insomnia.     Objective: BP 100/60   Ht 5\' 8"  (1.727 m)   Wt 175 lb (79.4 kg)   BMI 26.61 kg/m  Filed Weights   12/24/19 1433  Weight: 175 lb (79.4 kg)   Body mass index is 26.61 kg/m. Physical Exam Constitutional:      General: She is not in acute distress.    Appearance: She is well-developed.  Genitourinary:     Pelvic exam was performed with patient supine.     Vulva, urethra, bladder, vagina and rectum normal.     No lesions in the vagina.     No vaginal bleeding.     Cervix is absent.     Uterus is absent.     No right or left adnexal mass present.     Right adnexa not tender.     Left adnexa not tender.     Genitourinary Comments: Vaginal cuff well healed   HENT:     Head: Normocephalic and atraumatic. No laceration.     Right Ear: Hearing normal.     Left Ear: Hearing normal.     Mouth/Throat:     Pharynx: Uvula midline.  Eyes:     Pupils: Pupils are equal, round, and reactive to light.  Neck:     Thyroid: No thyromegaly.  Cardiovascular:     Rate and Rhythm: Normal rate and regular rhythm.     Heart sounds: No murmur heard.  No friction rub. No gallop.   Pulmonary:     Effort: Pulmonary effort is normal. No respiratory distress.     Breath sounds: Normal breath sounds. No wheezing.  Chest:     Breasts:        Right: No mass, skin change or tenderness.        Left: No mass, skin change or tenderness.  Abdominal:     General: Bowel sounds are normal. There is no distension.     Palpations: Abdomen is soft.     Tenderness: There is no abdominal tenderness. There is no rebound.  Musculoskeletal:        General: Normal range of motion.     Cervical back: Normal range of motion and neck supple.  Neurological:     Mental Status: She is alert and oriented to person, place, and time.     Cranial Nerves: No cranial nerve deficit.  Skin:    General: Skin is warm and dry.  Psychiatric:        Judgment: Judgment normal.  Vitals reviewed.     Assessment: Annual Exam 1. Women's annual routine gynecological examination   2. Screen for colon cancer   3. Encounter for screening mammogram for malignant neoplasm of breast     Plan:            1.  Cervical Screening-  Pap smear schedule reviewed with patient  2. Breast screening- Exam annually and mammogram scheduled  3. Colonoscopy every 10 years, Hemoccult testing after age 75  4. Labs managed by PCP  5. Counseling for hormonal therapy: none              6. FRAX - FRAX score for assessing the 10 year probability for fracture calculated and discussed today.  Based on age and score today, DEXA is not currently scheduled.    F/U  Return in about 1 year (around 12/23/2020) for  Annual.  Annamarie Major, MD, [facility], [facility] 12/24/2019  2:52 PM

## 2020-06-17 ENCOUNTER — Telehealth: Payer: Self-pay

## 2020-06-17 NOTE — Telephone Encounter (Signed)
-----   Message from Nadara Mustard, MD sent at 06/17/2020 11:11 AM EST ----- Regarding: MMG Received notice she has not received MMG yet as ordered at her Annual. Please check and encourage her to do this, and document conversation.

## 2020-06-17 NOTE — Telephone Encounter (Signed)
Left message to advise pt to schedule her mammogram 

## 2020-06-22 ENCOUNTER — Telehealth: Payer: Self-pay

## 2020-06-22 NOTE — Telephone Encounter (Signed)
Left message to remind pt to schedule mammogram. 

## 2020-07-12 ENCOUNTER — Telehealth: Payer: Self-pay

## 2020-07-12 NOTE — Telephone Encounter (Signed)
Pt aware to schedule her mammogram. States she will call now.

## 2020-07-27 ENCOUNTER — Telehealth: Payer: Self-pay

## 2020-07-27 ENCOUNTER — Other Ambulatory Visit: Payer: Self-pay

## 2020-07-27 ENCOUNTER — Ambulatory Visit
Admission: RE | Admit: 2020-07-27 | Discharge: 2020-07-27 | Disposition: A | Discharge status: Home / Self Care | Payer: BC Managed Care – PPO | Source: Ambulatory Visit | Attending: Obstetrics & Gynecology | Admitting: Obstetrics & Gynecology

## 2020-07-27 DIAGNOSIS — Z1231 Encounter for screening mammogram for malignant neoplasm of breast: Secondary | ICD-10-CM | POA: Insufficient documentation

## 2020-07-27 NOTE — Telephone Encounter (Signed)
Pt had her mammogram today. 

## 2020-07-27 NOTE — Telephone Encounter (Signed)
-----   Message from Nadara Mustard, MD sent at 06/17/2020 11:11 AM EST ----- Regarding: MMG Received notice she has not received MMG yet as ordered at her Annual. Please check and encourage her to do this, and document conversation.

## 2020-07-28 ENCOUNTER — Encounter: Payer: Self-pay | Admitting: Obstetrics & Gynecology

## 2020-11-18 ENCOUNTER — Other Ambulatory Visit: Payer: Self-pay

## 2020-11-18 ENCOUNTER — Ambulatory Visit
Admission: EM | Admit: 2020-11-18 | Discharge: 2020-11-18 | Disposition: A | Discharge status: Home / Self Care | Payer: BC Managed Care – PPO | Attending: Sports Medicine | Admitting: Sports Medicine

## 2020-11-18 ENCOUNTER — Encounter: Payer: Self-pay | Admitting: Emergency Medicine

## 2020-11-18 DIAGNOSIS — U071 COVID-19: Secondary | ICD-10-CM

## 2020-11-18 DIAGNOSIS — R0981 Nasal congestion: Secondary | ICD-10-CM

## 2020-11-18 DIAGNOSIS — J069 Acute upper respiratory infection, unspecified: Secondary | ICD-10-CM

## 2020-11-18 DIAGNOSIS — R059 Cough, unspecified: Secondary | ICD-10-CM

## 2020-11-18 DIAGNOSIS — J3489 Other specified disorders of nose and nasal sinuses: Secondary | ICD-10-CM

## 2020-11-18 MED ORDER — FLUTICASONE PROPIONATE 50 MCG/ACT NA SUSP: 2.0000 | Freq: Every day | NASAL | 0 refills | Status: AC

## 2020-11-18 MED ORDER — PROMETHAZINE-DM 6.25-15 MG/5ML PO SYRP: 5.0000 mL | ORAL_SOLUTION | Freq: Four times a day (QID) | ORAL | 0 refills | Status: AC | PRN

## 2020-11-18 NOTE — Discharge Instructions (Addendum)
As we discussed, with your positive home COVID test I am recommending that you quarantine for least 5 days per current CDC guidelines.  You can read about that online.  I provided you a work note. We agree that you do not need to do a repeat test today. I did prescribe something for cough with DM, and a nasal steroid.  I want you to purchase over-the-counter guaifenesin also called Mucinex without the DM component. I provided you with a work note. Educational handouts provided. Plenty of rest, plenty fluids, Tylenol or Motrin for any fever or discomfort. If your symptoms were to worsen in any way I want you to call 911 or go to the ER. If your symptoms persist please see your primary care provider.

## 2020-11-18 NOTE — ED Triage Notes (Signed)
Patient c/o runny nose, congestion and slight cough that started yesterday.  Patient denies fever.  Patient took home test and was positive for COVID today.

## 2020-11-18 NOTE — ED Provider Notes (Signed)
[facility]    CSN: [id number] Arrival date & time: 11/18/20  1839      History   Chief Complaint Chief Complaint  Patient presents with   Cough    COVID+ home test   Nasal Congestion    HPI Brittany Ewing is a 55 y.o. female.   55 year old female who presents for evaluation of URI symptoms.  Her symptoms began yesterday and included runny nose, nasal congestion, cough, rhinorrhea, and sneezing.  She was scheduled to meet her elderly mother today so she took a COVID test which was positive.  At the insistence of her children she comes in to the urgent care for evaluation.  She denies any fever shakes chills.  No myalgias.  She denies any recent COVID exposure.  No flu exposure.  No chest pain or shortness of breath.  No nausea vomiting or diarrhea.  She has been vaccinated against COVID x2 and has received both boosters.  She is also received her flu shot.  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  No red flag signs or symptoms elicited on history.   Past Medical History:  Diagnosis Date   Family history of pancreatic cancer    9/20 genetic testing letter sent   Migraine     Patient Active Problem List   Diagnosis Date Noted   Labial abscess 09/17/2017    Past Surgical History:  Procedure Laterality Date   BREAST CYST ASPIRATION     Pt does not remember   TONSILLECTOMY     VAGINAL HYSTERECTOMY     WISDOM TOOTH EXTRACTION      OB History     Gravida  1   Para  1   Term  1   Preterm      AB      Living  1      SAB      IAB      Ectopic      Multiple      Live Births               Home Medications    Prior to Admission medications   Medication Sig Start Date End Date Taking? Authorizing Provider  ALPRAZolam (XANAX) 0.25 MG tablet TAKE 1 TABLET BY MOUTH TWICE A DAY 06/20/17  Yes [provider]  buPROPion (WELLBUTRIN XL) 150 MG 24 hr tablet Take 150 mg by mouth daily. 11/06/20  Yes [provider]   diclofenac (VOLTAREN) 75 MG EC tablet TAKE 1 TABLET TWICE DAILY WITH FOOD 03/10/17  Yes [provider]  docusate sodium (COLACE) 100 MG capsule Take by mouth.   Yes [provider]  DULoxetine (CYMBALTA) 30 MG capsule TAKE 1 CAPSULE (30 MG TOTAL) BY MOUTH ONCE DAILY. 11/23/16  Yes [provider]  famotidine (PEPCID) 40 MG tablet Take by mouth. 06/10/20  Yes [provider]  fluticasone (FLONASE) 50 MCG/ACT nasal spray Place 2 sprays into both nostrils daily. 11/18/20  Yes Delton See, MD  Magnesium 250 MG TABS Take by mouth.   Yes [provider]  Multiple Vitamin (MULTI-VITAMINS) TABS Take by mouth.   Yes [provider]  pantoprazole (PROTONIX) 40 MG tablet Take by mouth. 02/11/17 11/18/20 Yes [provider]  promethazine-dextromethorphan (PROMETHAZINE-DM) 6.25-15 MG/5ML syrup Take 5 mLs by mouth 4 (four) times daily as needed for cough. 11/18/20  Yes Delton See, MD  SUMAtriptan (IMITREX) 50 MG tablet TAKE 1 TABLET BY MOUTH AS NEEDED FOR MIGRAINE  FOR 1 DOSE-MAY TAKE 2ND DOSE AFTER 2 HOURS IF NEEDED 05/13/17  Yes [provider]    Family History Family History  Problem Relation Age of Onset   Heart disease Maternal Grandmother    Heart disease Maternal Grandfather    Heart disease Paternal Grandmother    Heart disease Paternal Grandfather    Pancreatic cancer Maternal Aunt 40   Colon cancer Maternal Aunt 40   Breast cancer Neg Hx     Social History Social History   Tobacco Use   Smoking status: Never   Smokeless tobacco: Never  Vaping Use   Vaping Use: Never used  Substance Use Topics   Alcohol use: Not Currently   Drug use: Never     Allergies   Erythromycin and Venlafaxine   Review of Systems Review of Systems  Constitutional:  Negative for activity change, appetite change, chills, diaphoresis, fatigue and fever.  HENT:  Positive for congestion, postnasal drip, rhinorrhea and sneezing.  Negative for ear pain, sinus pressure, sinus pain and sore throat.   Eyes:  Negative for pain.  Respiratory:  Positive for cough. Negative for chest tightness, shortness of breath and wheezing.   Cardiovascular:  Negative for chest pain and palpitations.  Gastrointestinal:  Negative for abdominal pain, diarrhea, nausea and vomiting.  Genitourinary:  Negative for dysuria.  Musculoskeletal:  Negative for back pain, myalgias and neck pain.  Skin:  Negative for color change, pallor, rash and wound.  Neurological:  Negative for dizziness, syncope, light-headedness and headaches.  All other systems reviewed and are negative.   Physical Exam Triage Vital Signs ED Triage Vitals  Enc Vitals Group     BP 11/18/20 1903 117/77     Pulse Rate 11/18/20 1903 77     Resp 11/18/20 1903 14     Temp 11/18/20 1903 98.3 F (36.8 C)     Temp Source 11/18/20 1903 Oral     SpO2 11/18/20 1903 100 %     Weight 11/18/20 1900 175 lb (79.4 kg)     Height 11/18/20 1900 5\' 8"  (1.727 m)     Head Circumference --      Peak Flow --      Pain Score 11/18/20 1900 0     Pain Loc --      Pain Edu? --      Excl. in GC? --    No data found.  Updated Vital Signs BP 117/77 (BP Location: Left Arm)   Pulse 77   Temp 98.3 F (36.8 C) (Oral)   Resp 14   Ht 5\' 8"  (1.727 m)   Wt 79.4 kg   SpO2 100%   BMI 26.61 kg/m   Visual Acuity Right Eye Distance:   Left Eye Distance:   Bilateral Distance:    Right Eye Near:   Left Eye Near:    Bilateral Near:     Physical Exam Vitals and nursing note reviewed.  Constitutional:      General: She is not in acute distress.    Appearance: Normal appearance. She is not ill-appearing, toxic-appearing or diaphoretic.  HENT:     Head: Normocephalic and atraumatic.     Right Ear: Tympanic membrane normal.     Left Ear: Tympanic membrane normal.     Nose: Congestion and rhinorrhea present.     Mouth/Throat:     Mouth: Mucous membranes are moist.     Pharynx: No  oropharyngeal exudate or posterior oropharyngeal erythema.  Eyes:  General: No scleral icterus.       Right eye: No discharge.        Left eye: No discharge.     Extraocular Movements: Extraocular movements intact.     Conjunctiva/sclera: Conjunctivae normal.     Pupils: Pupils are equal, round, and reactive to light.  Cardiovascular:     Rate and Rhythm: Normal rate and regular rhythm.     Pulses: Normal pulses.     Heart sounds: Normal heart sounds. No murmur heard.   No friction rub. No gallop.  Pulmonary:     Effort: Pulmonary effort is normal.     Breath sounds: Normal breath sounds. No stridor. No wheezing, rhonchi or rales.  Musculoskeletal:     Cervical back: Normal range of motion and neck supple. No rigidity or tenderness.  Skin:    General: Skin is warm and dry.     Capillary Refill: Capillary refill takes less than 2 seconds.  Neurological:     General: No focal deficit present.     Mental Status: She is alert and oriented to person, place, and time.     UC Treatments / Results  Labs (all labs ordered are listed, but only abnormal results are displayed) Labs Reviewed - No data to display  EKG   Radiology No results found.  Procedures Procedures (including critical care time)  Medications Ordered in UC Medications - No data to display  Initial Impression / Assessment and Plan / UC Course  I have reviewed the triage vital signs and the nursing notes.  Pertinent labs & imaging results that were available during my care of the patient were reviewed by me and considered in my medical decision making (see chart for details).  Clinical impression: 1.  Positive COVID-19 test at home. 2.  Upper respiratory infection 3.  Cough 4.  Rhinorrhea 5.  Nasal congestion  Treatment plan: 1.  The findings and treatment plan were discussed in detail with the patient.  Patient was in agreement. 2.  Given that she does have a positive COVID home test and she is  symptomatic I am recommending that she quarantine for least 5 days per the current CDC guidelines.  I provided a work note to that effect. 3.  We discussed repeating her test but we both agree that that was not necessary. 4.  I did prescribe her a cough medicine.  Also added in Flonase for her nasal congestion.  She can pick up Mucinex without the DM component as there is going to be DM and the cough medicine.  That will help thin secretions. 5.  Educational handouts provided. 6.  Plenty of rest, and plenty of fluids, Tylenol or Motrin for any fever or discomfort. 7.  If after the quarantine ends if she is still having some issues with her infection then she should see her primary care provider. 8.  If she is having any issues getting over this or her symptoms worsen then she needs to call 911 or go to the ER.  She voiced verbal understanding. 9.  She was stable upon discharge and will follow-up here as needed.    Final Clinical Impressions(s) / UC Diagnoses   Final diagnoses:  COVID  Upper respiratory tract infection, unspecified type  Cough  Nasal congestion  Rhinorrhea     Discharge Instructions      As we discussed, with your positive home COVID test I am recommending that you quarantine for least 5 days per current CDC guidelines.  You can read about that online.  I provided you a work note. We agree that you do not need to do a repeat test today. I did prescribe something for cough with DM, and a nasal steroid.  I want you to purchase over-the-counter guaifenesin also called Mucinex without the DM component. I provided you with a work note. Educational handouts provided. Plenty of rest, plenty fluids, Tylenol or Motrin for any fever or discomfort. If your symptoms were to worsen in any way I want you to call 911 or go to the ER. If your symptoms persist please see your primary care provider.     ED Prescriptions     Medication Sig Dispense Auth. Provider    promethazine-dextromethorphan (PROMETHAZINE-DM) 6.25-15 MG/5ML syrup Take 5 mLs by mouth 4 (four) times daily as needed for cough. 180 mL Delton SeeBarnes, Taiana Temkin, MD   fluticasone [employer](FLONASE) 50 MCG/ACT nasal spray Place 2 sprays into both nostrils daily. 15.8 mL Delton SeeBarnes, Dhyana Bastone, MD      PDMP not reviewed this encounter.   Delton SeeBarnes, Alp Goldwater, MD 11/19/20 [phone]

## 2021-01-16 ENCOUNTER — Encounter: Payer: Self-pay | Admitting: Obstetrics & Gynecology

## 2021-01-16 ENCOUNTER — Ambulatory Visit (INDEPENDENT_AMBULATORY_CARE_PROVIDER_SITE_OTHER): Payer: BC Managed Care – PPO | Admitting: Obstetrics & Gynecology

## 2021-01-16 ENCOUNTER — Other Ambulatory Visit: Payer: Self-pay

## 2021-01-16 VITALS — BP 120/80 | Ht 68.0 in | Wt 168.0 lb

## 2021-01-16 DIAGNOSIS — Z1211 Encounter for screening for malignant neoplasm of colon: Secondary | ICD-10-CM | POA: Diagnosis not present

## 2021-01-16 DIAGNOSIS — Z01419 Encounter for gynecological examination (general) (routine) without abnormal findings: Secondary | ICD-10-CM

## 2021-01-16 DIAGNOSIS — Z1231 Encounter for screening mammogram for malignant neoplasm of breast: Secondary | ICD-10-CM | POA: Diagnosis not present

## 2021-01-16 NOTE — Patient Instructions (Addendum)
PAP every 5 years Mammogram every year (due in Apr 2023)    Call [phone] to schedule at [facility] Colonoscopy every 10 years Labs yearly (with PCP)  Thank you for choosing [facility]. As part of our ongoing efforts to improve patient experience, we would appreciate your feedback. Please fill out the short survey that you will receive by mail or MyChart. Your opinion is important to [place]! - Dr. Tiburcio Pea  Recommendations to boost your immunity to prevent illness such as viral flu and colds, including covid19, are as follows:       - - -  Vitamin K2 and Vitamin D3  - - - Take Vitamin K2 at 200-300 mcg daily (usually 2-3 pills daily of the over the counter formulation). Take Vitamin D3 at 3000-4000 U daily (usually 3-4 pills daily of the over the counter formulation). Studies show that these two at high normal levels in your system are very effective in keeping your immunity so strong and protective that you will be unlikely to contract viral illness such as those listed above.  Also, take Calcium with the vitamin D to keep bone density strong and healthy.  Dr Tiburcio Pea

## 2021-01-16 NOTE — Progress Notes (Signed)
HPI:      [name] is a 55 y.o. G1P1001 who LMP was in the past, she presents today for her annual examination.  Prior TVH. The patient has no complaints today other than chronic constipation. . The patient is sexually active. Herlast pap: approximate date 2019 and was normal and last mammogram: approximate date 2022 and was normal.  The patient does perform self breast exams.  There is no notable family history of breast or ovarian cancer in her family. The patient is not taking hormone replacement therapy. Patient denies post-menopausal vaginal bleeding.   The patient has regular exercise: yes. The patient denies current symptoms of depression.    GYN Hx: Last Colonoscopy:4 years ago. Normal.  Last DEXA:  never  ago.    PMHx: Past Medical History:  Diagnosis Date   Family history of pancreatic cancer    9/20 genetic testing letter sent   Migraine    Past Surgical History:  Procedure Laterality Date   BREAST CYST ASPIRATION     Pt does not remember   TONSILLECTOMY     VAGINAL HYSTERECTOMY     WISDOM TOOTH EXTRACTION     Family History  Problem Relation Age of Onset   Heart disease Maternal Grandmother    Heart disease Maternal Grandfather    Heart disease Paternal Grandmother    Heart disease Paternal Grandfather    Pancreatic cancer Maternal Aunt 40   Colon cancer Maternal Aunt 40   Breast cancer Neg Hx    Social History   Tobacco Use   Smoking status: Never   Smokeless tobacco: Never  Vaping Use   Vaping Use: Never used  Substance Use Topics   Alcohol use: Not Currently   Drug use: Never    Current Outpatient Medications:    ALPRAZolam (XANAX) 0.25 MG tablet, TAKE 1 TABLET BY MOUTH TWICE A DAY, Disp: , Rfl:    buPROPion (WELLBUTRIN XL) 150 MG 24 hr tablet, Take 150 mg by mouth daily., Disp: , Rfl:    diclofenac (VOLTAREN) 75 MG EC tablet, TAKE 1 TABLET TWICE DAILY WITH FOOD, Disp: , Rfl:    docusate sodium (COLACE) 100 MG capsule, Take by mouth., Disp:  , Rfl:    DULoxetine (CYMBALTA) 30 MG capsule, TAKE 1 CAPSULE (30 MG TOTAL) BY MOUTH ONCE DAILY., Disp: , Rfl:    famotidine (PEPCID) 40 MG tablet, Take by mouth., Disp: , Rfl:    linaclotide (LINZESS) 72 MCG capsule, Take by mouth., Disp: , Rfl:    Magnesium 250 MG TABS, Take by mouth., Disp: , Rfl:    Multiple Vitamin (MULTI-VITAMINS) TABS, Take by mouth., Disp: , Rfl:    SUMAtriptan (IMITREX) 50 MG tablet, TAKE 1 TABLET BY MOUTH AS NEEDED FOR MIGRAINE FOR 1 DOSE-MAY TAKE 2ND DOSE AFTER 2 HOURS IF NEEDED, Disp: , Rfl:    fluticasone (FLONASE) 50 MCG/ACT nasal spray, Place 2 sprays into both nostrils daily. (Patient not taking: Reported on 01/16/2021), Disp: 15.8 mL, Rfl: 0   pantoprazole (PROTONIX) 40 MG tablet, Take by mouth., Disp: , Rfl:    promethazine-dextromethorphan (PROMETHAZINE-DM) 6.25-15 MG/5ML syrup, Take 5 mLs by mouth 4 (four) times daily as needed for cough. (Patient not taking: Reported on 01/16/2021), Disp: 180 mL, Rfl: 0 Allergies: Erythromycin and Venlafaxine  Review of Systems  Constitutional:  Negative for chills, fever and malaise/fatigue.  HENT:  Negative for congestion, sinus pain and sore throat.   Eyes:  Negative for blurred vision and pain.  Respiratory:  Negative for cough and wheezing.   Cardiovascular:  Negative for chest pain and leg swelling.  Gastrointestinal:  Positive for constipation. Negative for abdominal pain, diarrhea, heartburn, nausea and vomiting.  Genitourinary:  Negative for dysuria, frequency, hematuria and urgency.  Musculoskeletal:  Negative for back pain, joint pain, myalgias and neck pain.  Skin:  Negative for itching and rash.  Neurological:  Negative for dizziness, tremors and weakness.  Endo/Heme/Allergies:  Does not bruise/bleed easily.  Psychiatric/Behavioral:  Negative for depression. The patient is not nervous/anxious and does not have insomnia.    Objective: BP 120/80   Ht 5\' 8"  (1.727 m)   Wt 168 lb (76.2 kg)   BMI 25.54 kg/m    Filed Weights   01/16/21 1426  Weight: 168 lb (76.2 kg)   Body mass index is 25.54 kg/m. Physical Exam Constitutional:      General: She is not in acute distress.    Appearance: She is well-developed.  Genitourinary:     Vulva, bladder, rectum and urethral meatus normal.     No lesions in the vagina.     Genitourinary Comments: Vaginal cuff well healed     Right Labia: No rash, tenderness or lesions.    Left Labia: No tenderness, lesions or rash.    No vaginal bleeding.      Right Adnexa: not tender and no mass present.    Left Adnexa: not tender and no mass present.    Cervix is absent.     Uterus is absent.     Pelvic exam was performed with patient in the lithotomy position.  Breasts:    Right: No mass, skin change or tenderness.     Left: No mass, skin change or tenderness.  HENT:     Head: Normocephalic and atraumatic. No laceration.     Right Ear: Hearing normal.     Left Ear: Hearing normal.     Mouth/Throat:     Pharynx: Uvula midline.  Eyes:     Pupils: Pupils are equal, round, and reactive to light.  Neck:     Thyroid: No thyromegaly.  Cardiovascular:     Rate and Rhythm: Normal rate and regular rhythm.     Heart sounds: No murmur heard.   No friction rub. No gallop.  Pulmonary:     Effort: Pulmonary effort is normal. No respiratory distress.     Breath sounds: Normal breath sounds. No wheezing.  Abdominal:     General: Bowel sounds are normal. There is no distension.     Palpations: Abdomen is soft.     Tenderness: There is no abdominal tenderness. There is no rebound.  Musculoskeletal:        General: Normal range of motion.     Cervical back: Normal range of motion and neck supple.  Neurological:     Mental Status: She is alert and oriented to person, place, and time.     Cranial Nerves: No cranial nerve deficit.  Skin:    General: Skin is warm and dry.  Psychiatric:        Judgment: Judgment normal.  Vitals reviewed.    Assessment: Annual  Exam 1. Women's annual routine gynecological examination   2. Screen for colon cancer   3. Encounter for screening mammogram for malignant neoplasm of breast     Plan:            1.  Vaginal Screening-  Pap smear schedule reviewed with patient, Pap smear to be scheduled, every 5  yrs (2024)  2. Breast screening- Exam annually and mammogram scheduled  3. Colonoscopy every 10 years, Hemoccult testing after age 16  4. Labs managed by PCP  5. Counseling for hormonal therapy: none              6. FRAX - FRAX score for assessing the 10 year probability for fracture calculated and discussed today.  Based on age and score today, DEXA is not currently scheduled.    F/U  Return in about 1 year (around 01/16/2022) for Annual.  Annamarie Major, MD, [facility], [facility] 01/16/2021  2:44 PM

## 2021-01-25 ENCOUNTER — Other Ambulatory Visit (HOSPITAL_COMMUNITY): Payer: Self-pay

## 2021-01-25 MED ORDER — INFLUENZA VAC SPLIT QUAD 0.5 ML IM SUSY
PREFILLED_SYRINGE | INTRAMUSCULAR | 0 refills | Status: AC
  Filled 2021-01-25: qty 0.5, 1d supply, fill #0

## 2021-08-01 ENCOUNTER — Other Ambulatory Visit: Payer: Self-pay | Admitting: Internal Medicine

## 2021-08-01 DIAGNOSIS — Z1231 Encounter for screening mammogram for malignant neoplasm of breast: Secondary | ICD-10-CM

## 2021-08-03 ENCOUNTER — Ambulatory Visit
Admission: RE | Admit: 2021-08-03 | Discharge: 2021-08-03 | Disposition: A | Discharge status: Home / Self Care | Payer: BC Managed Care – PPO | Source: Ambulatory Visit | Attending: Internal Medicine | Admitting: Internal Medicine

## 2021-08-03 DIAGNOSIS — Z1231 Encounter for screening mammogram for malignant neoplasm of breast: Secondary | ICD-10-CM | POA: Insufficient documentation

## 2022-01-23 ENCOUNTER — Other Ambulatory Visit (HOSPITAL_BASED_OUTPATIENT_CLINIC_OR_DEPARTMENT_OTHER): Payer: Self-pay

## 2022-01-23 MED ORDER — FLUARIX QUADRIVALENT 0.5 ML IM SUSY
PREFILLED_SYRINGE | INTRAMUSCULAR | 0 refills | Status: AC
  Filled 2022-01-23: qty 0.5, 1d supply, fill #0

## 2022-06-27 ENCOUNTER — Other Ambulatory Visit: Payer: Self-pay | Admitting: Internal Medicine

## 2022-06-27 DIAGNOSIS — Z1231 Encounter for screening mammogram for malignant neoplasm of breast: Secondary | ICD-10-CM

## 2022-08-06 ENCOUNTER — Ambulatory Visit
Admission: RE | Admit: 2022-08-06 | Discharge: 2022-08-06 | Disposition: A | Discharge status: Home / Self Care | Payer: BC Managed Care – PPO | Source: Ambulatory Visit | Attending: Internal Medicine | Admitting: Internal Medicine

## 2022-08-06 DIAGNOSIS — Z1231 Encounter for screening mammogram for malignant neoplasm of breast: Secondary | ICD-10-CM | POA: Insufficient documentation

## 2023-03-02 IMAGING — MG MM DIGITAL SCREENING BILAT W/ TOMO AND CAD
8 series · 9 of 24 positions shown · non-contrast
Comparison: Previous exam(s).

CLINICAL DATA: Screening.

EXAM:
DIGITAL SCREENING BILATERAL MAMMOGRAM WITH TOMOSYNTHESIS AND CAD
TECHNIQUE: Bilateral screening digital craniocaudal and mediolateral oblique
mammograms were obtained. Bilateral screening digital breast
tomosynthesis was performed. The images were evaluated with
computer-aided detection.

[L CC synth-2D]
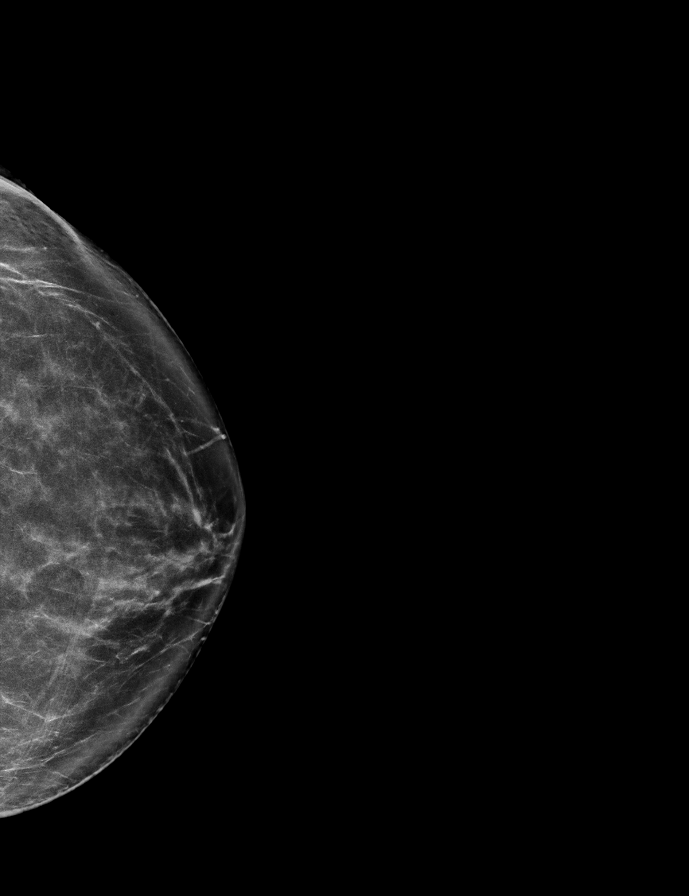

[L MLO synth-2D]
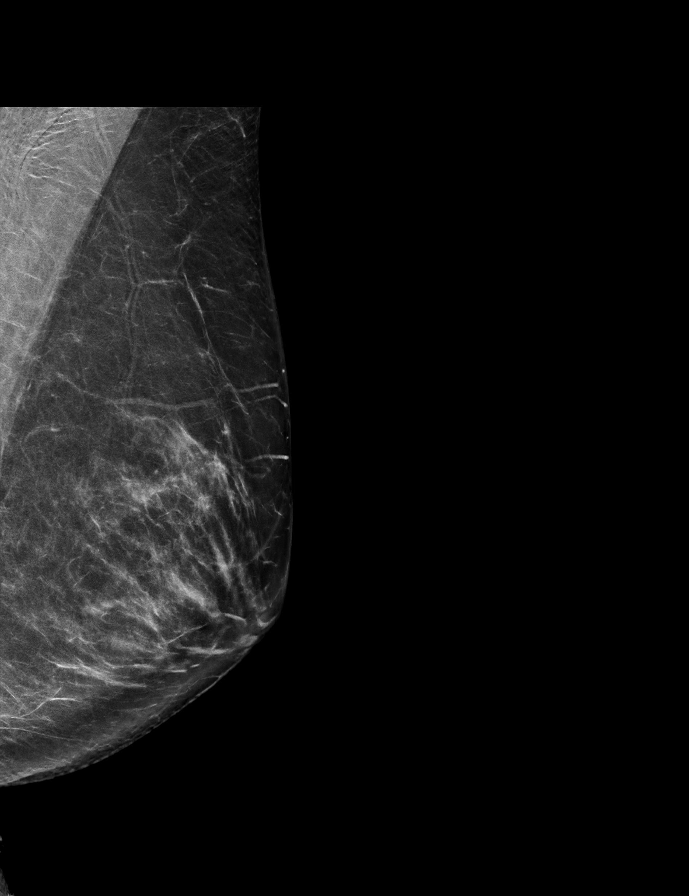

[R MLO synth-2D]
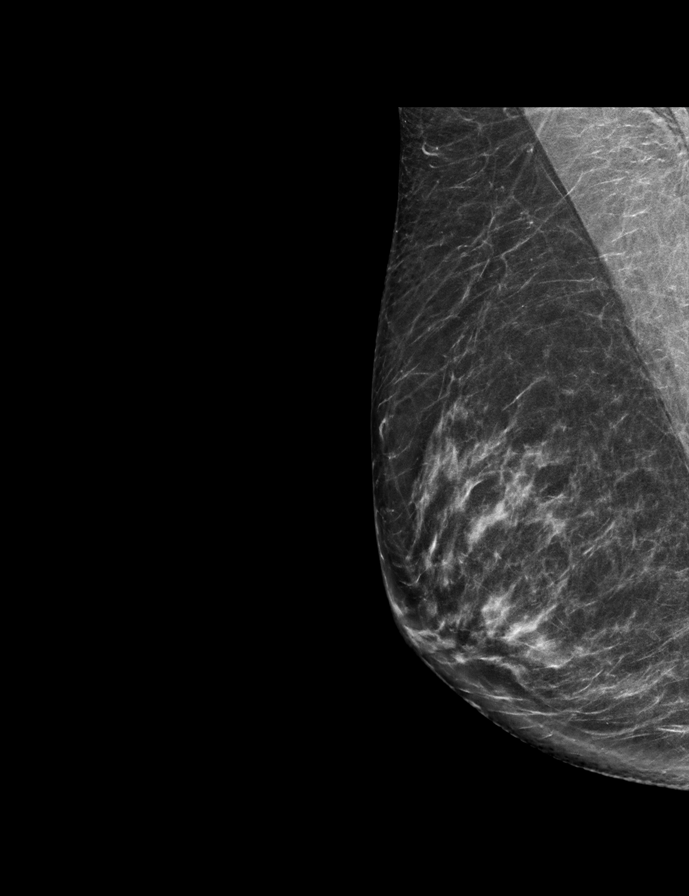

[R CC synth-2D]
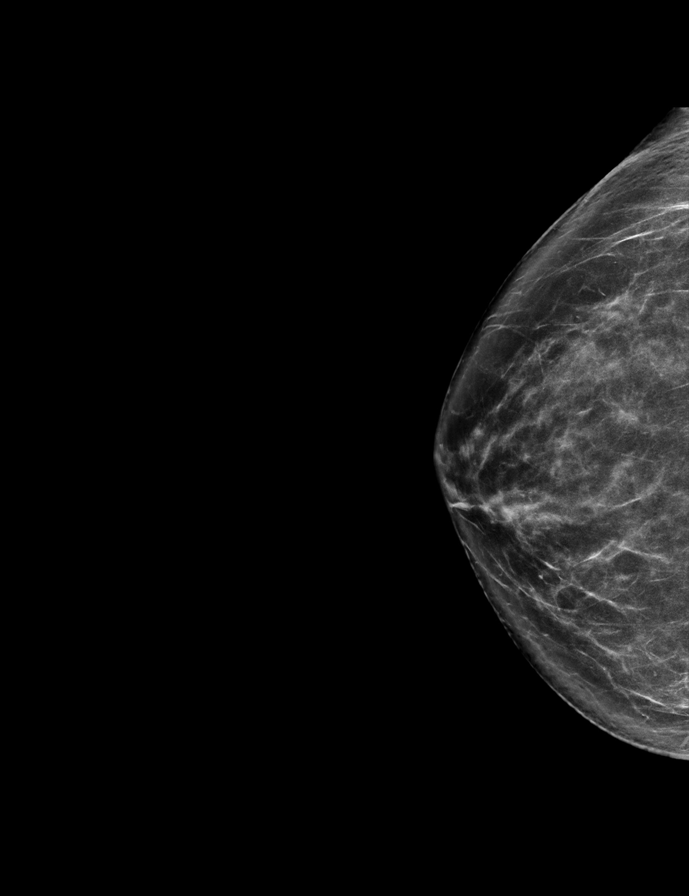

[L CC tomo · 2 of 73 frames shown]
[frame 24/73]
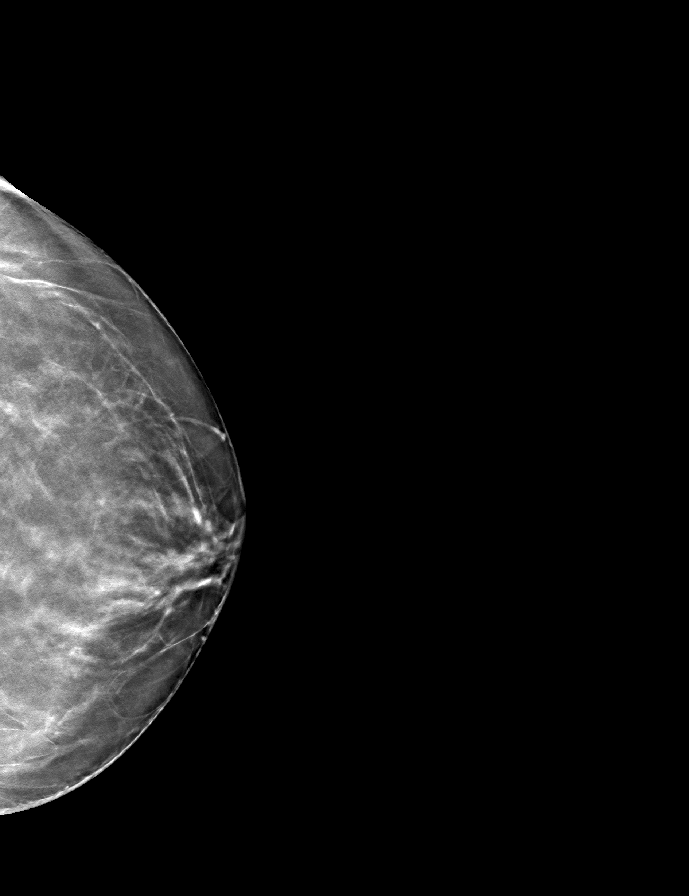
[frame 37/73]
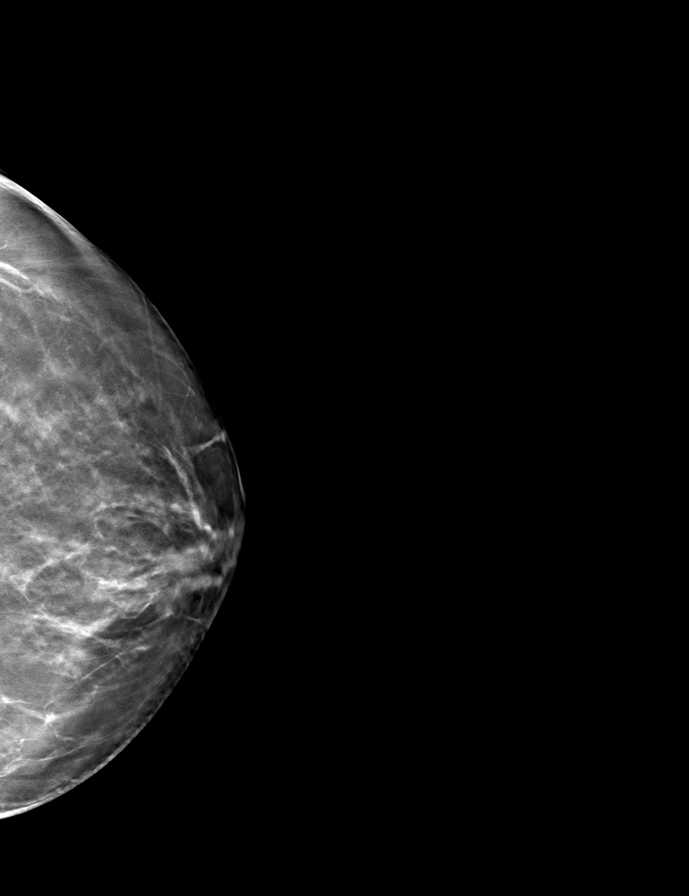

[R MLO tomo · tomo slice 35/69.0]
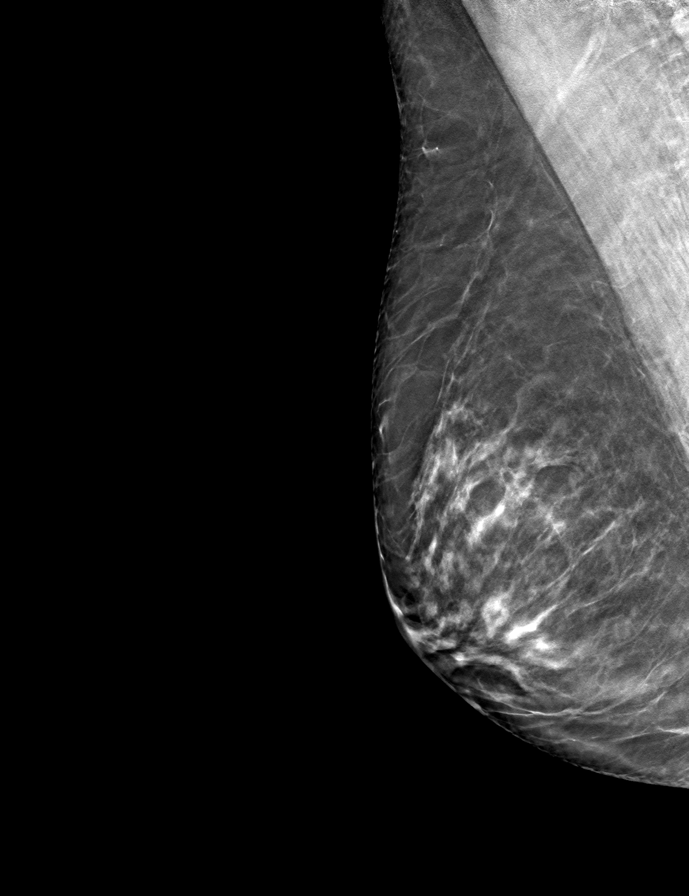

[L MLO tomo · tomo slice 35/69.0]
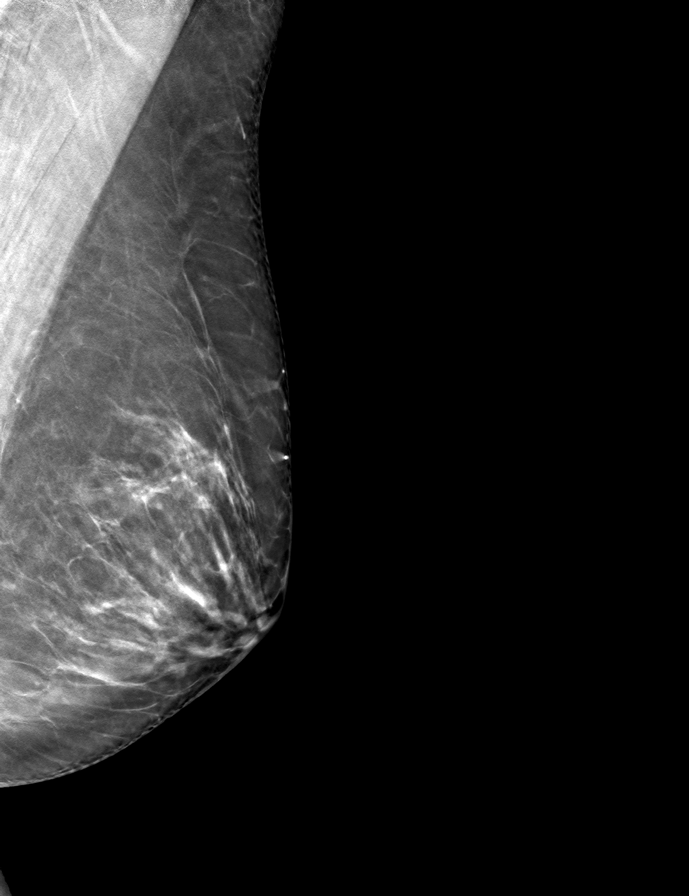

[R CC tomo · tomo slice 36/71.0]
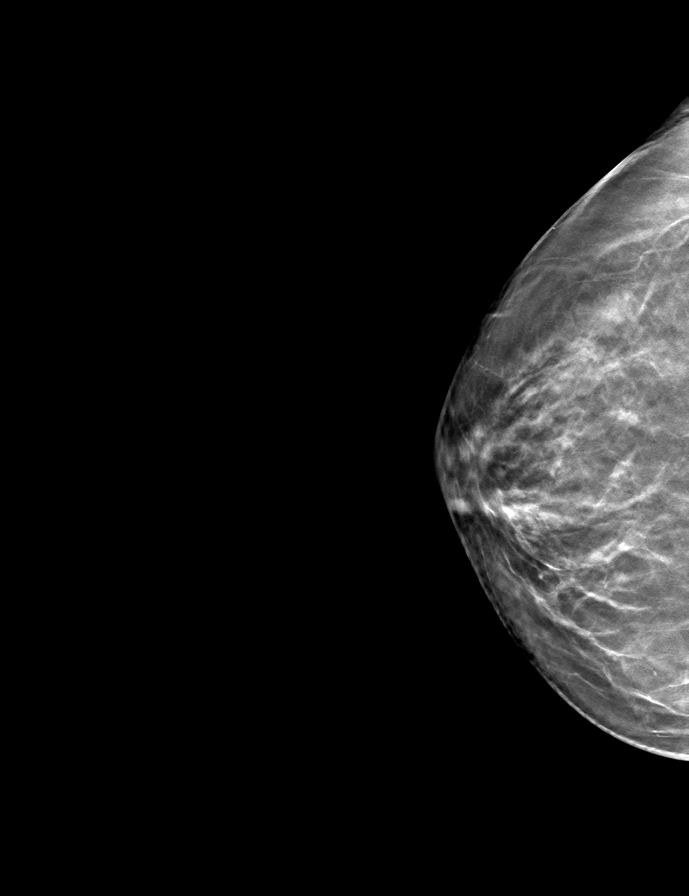

[9 of 24 positions shown; findings below may reference images not displayed]

ACR Breast Density Category b: There are scattered areas of
fibroglandular density.
FINDINGS: There are no findings suspicious for malignancy.
IMPRESSION: No mammographic evidence of malignancy. A result letter of this
screening mammogram will be mailed directly to the patient.

RECOMMENDATION:
Screening mammogram in one year. (Code:51-O-LD2)

BI-RADS CATEGORY  1: Negative.

## 2023-07-02 ENCOUNTER — Other Ambulatory Visit: Payer: Self-pay | Admitting: Internal Medicine

## 2023-07-02 DIAGNOSIS — Z1231 Encounter for screening mammogram for malignant neoplasm of breast: Secondary | ICD-10-CM

## 2023-08-08 ENCOUNTER — Encounter

## 2023-08-15 ENCOUNTER — Encounter

## 2023-09-06 ENCOUNTER — Ambulatory Visit
Admission: RE | Admit: 2023-09-06 | Discharge: 2023-09-06 | Disposition: A | Discharge status: Home / Self Care | Payer: PRIVATE HEALTH INSURANCE | Source: Ambulatory Visit | Attending: Internal Medicine | Admitting: Internal Medicine

## 2023-09-06 DIAGNOSIS — Z1231 Encounter for screening mammogram for malignant neoplasm of breast: Secondary | ICD-10-CM | POA: Diagnosis present
